# Patient Record
Sex: Male | Born: 1965 | Race: White | Hispanic: No | Marital: Married | Smoking: Never smoker
Health system: Southern US, Community
[De-identification: ages and names within clinical notes are randomized; demographics above are authoritative.]

## PROBLEM LIST (undated history)

## (undated) HISTORY — PX: KNEE SURGERY: SHX244

---

## 1997-08-20 ENCOUNTER — Other Ambulatory Visit: Admission: RE | Admit: 1997-08-20 | Discharge: 1997-08-20 | Payer: Self-pay | Admitting: Obstetrics and Gynecology

## 1997-09-13 ENCOUNTER — Other Ambulatory Visit: Admission: RE | Admit: 1997-09-13 | Discharge: 1997-09-13 | Payer: Self-pay | Admitting: Obstetrics and Gynecology

## 1997-10-04 ENCOUNTER — Other Ambulatory Visit: Admission: RE | Admit: 1997-10-04 | Discharge: 1997-10-04 | Payer: Self-pay | Admitting: Obstetrics and Gynecology

## 1998-02-05 ENCOUNTER — Ambulatory Visit (HOSPITAL_COMMUNITY): Admission: RE | Admit: 1998-02-05 | Discharge: 1998-02-05 | Payer: Self-pay | Admitting: Gynecology

## 2011-04-05 ENCOUNTER — Emergency Department (HOSPITAL_BASED_OUTPATIENT_CLINIC_OR_DEPARTMENT_OTHER)
Admission: EM | Admit: 2011-04-05 | Discharge: 2011-04-05 | Disposition: A | Discharge status: Home / Self Care | Payer: BC Managed Care – PPO | Attending: Emergency Medicine | Admitting: Emergency Medicine

## 2011-04-05 ENCOUNTER — Encounter (HOSPITAL_BASED_OUTPATIENT_CLINIC_OR_DEPARTMENT_OTHER): Payer: Self-pay | Admitting: *Deleted

## 2011-04-05 ENCOUNTER — Emergency Department (INDEPENDENT_AMBULATORY_CARE_PROVIDER_SITE_OTHER): Payer: BC Managed Care – PPO

## 2011-04-05 DIAGNOSIS — R05 Cough: Secondary | ICD-10-CM

## 2011-04-05 DIAGNOSIS — J029 Acute pharyngitis, unspecified: Secondary | ICD-10-CM | POA: Insufficient documentation

## 2011-04-05 DIAGNOSIS — J069 Acute upper respiratory infection, unspecified: Secondary | ICD-10-CM | POA: Insufficient documentation

## 2011-04-05 DIAGNOSIS — R6883 Chills (without fever): Secondary | ICD-10-CM

## 2011-04-05 DIAGNOSIS — R52 Pain, unspecified: Secondary | ICD-10-CM

## 2011-04-05 MED ORDER — BENZONATATE 100 MG PO CAPS: 100.0000 mg | ORAL_CAPSULE | Freq: Three times a day (TID) | ORAL | Status: AC

## 2011-04-05 MED ORDER — OXYMETAZOLINE HCL 0.05 % NA SOLN: 2.0000 | Freq: Two times a day (BID) | NASAL | Status: AC

## 2011-04-05 MED ORDER — IBUPROFEN 600 MG PO TABS
600.0000 mg | ORAL_TABLET | Freq: Four times a day (QID) | ORAL | Status: AC | PRN
Start: 2011-04-05 — End: 2011-04-15

## 2011-04-05 NOTE — ED Provider Notes (Signed)
History     CSN: [id number]  Arrival date & time 04/05/11  1216   First MD Initiated Contact with Patient 04/05/11 1358      Chief Complaint  Patient presents with  . URI    (Consider location/radiation/quality/duration/timing/severity/associated sxs/prior treatment) HPI  45yoM is a healthy presents with multiple complaints. The patient states that for the past 5 days he has experience body aches, rhinorrhea, sore throat, nausea, chills. There's been no fever. He's been exposed to his wife who is tested by her primary care doctor 7 days ago for influenza. He was positive. The patient complains of fatigue. He denies headache, neck stiffness, back pain. He denies weakness. Denies shortness of breath.   ED Notes, ED Provider Notes from 04/05/11 0000 to 04/05/11 12:48:43       Julieanne Manson, RN 04/05/2011 12:46      Cough, runny nose, sore throat, nausea, and chills x 2 days.     History reviewed. No pertinent past medical history.  Past Surgical History  Procedure Date  . Knee surgery     No family history on file.  History  Substance Use Topics  . Smoking status: Never Smoker   . Smokeless tobacco: Not on file  . Alcohol Use: No    Review of Systems  All other systems reviewed and are negative.   except as noted HPI     Allergies  Review of patient's allergies indicates no known allergies.  Home Medications   Current Outpatient Rx  Name Route Sig Dispense Refill  . BENZONATATE 100 MG PO CAPS Oral Take 1 capsule (100 mg total) by mouth every 8 (eight) hours. 21 capsule 0  . IBUPROFEN 600 MG PO TABS Oral Take 1 tablet (600 mg total) by mouth every 6 (six) hours as needed for pain. 30 tablet 0  . OXYMETAZOLINE HCL 0.05 % NA SOLN Nasal Place 2 sprays into the nose 2 (two) times daily. 30 mL 0    BP 115/78  Pulse 82  Temp(Src) 98.2 F (36.8 C) (Oral)  Resp 22  SpO2 97%  Physical Exam  Nursing note and vitals reviewed. Constitutional: He is oriented to  person, place, and time. He appears well-developed and well-nourished. No distress.  HENT:  Head: Atraumatic.  Mouth/Throat: Oropharynx is clear and moist.       Mild erythema posterior OP +nasal congestion  Eyes: Conjunctivae are normal. Pupils are equal, round, and reactive to light.  Neck: Neck supple.  Cardiovascular: Normal rate, regular rhythm, normal heart sounds and intact distal pulses.  Exam reveals no gallop and no friction rub.   No murmur heard. Pulmonary/Chest: Effort normal. No respiratory distress. He has no wheezes. He has no rales.  Abdominal: Soft. Bowel sounds are normal. There is no tenderness. There is no rebound and no guarding.  Musculoskeletal: Normal range of motion. He exhibits no edema and no tenderness.  Neurological: He is alert and oriented to person, place, and time.  Skin: Skin is warm and dry.  Psychiatric: He has a normal mood and affect.    ED Course  Procedures (including critical care time)  Labs Reviewed - No data to display Dg Chest 2 View  04/05/2011  *RADIOLOGY REPORT*  Clinical Data: Cough, chills, bodyaches.  CHEST - 2 VIEW  Comparison: None  Findings: Cardiomediastinal silhouette is within normal limits. The lungs are free of focal consolidations and pleural effusions. No pulmonary edema. Visualized osseous structures have a normal appearance.  IMPRESSION: Negative exam.  Original Report Authenticated By: Patterson Hammersmith, M.D.    1. Influenza-like illness     MDM  Influenza like illness x 5 days with worsening cough with exposure to influenza. CXR negative pna. Home with afrin, ibuprofen, tessalon. PMD f/u as needed.        Forbes Cellar, MD 04/05/11 [phone]

## 2011-04-05 NOTE — ED Notes (Signed)
Cough, runny nose, sore throat, nausea, and chills x 2 days.

## 2012-06-15 IMAGING — CR DG CHEST 2V
2 series · 2 of 2 positions shown · non-contrast
Comparison: None

CLINICAL DATA: Cough, chills, bodyaches.

CHEST - 2 VIEW

[w chest pa]
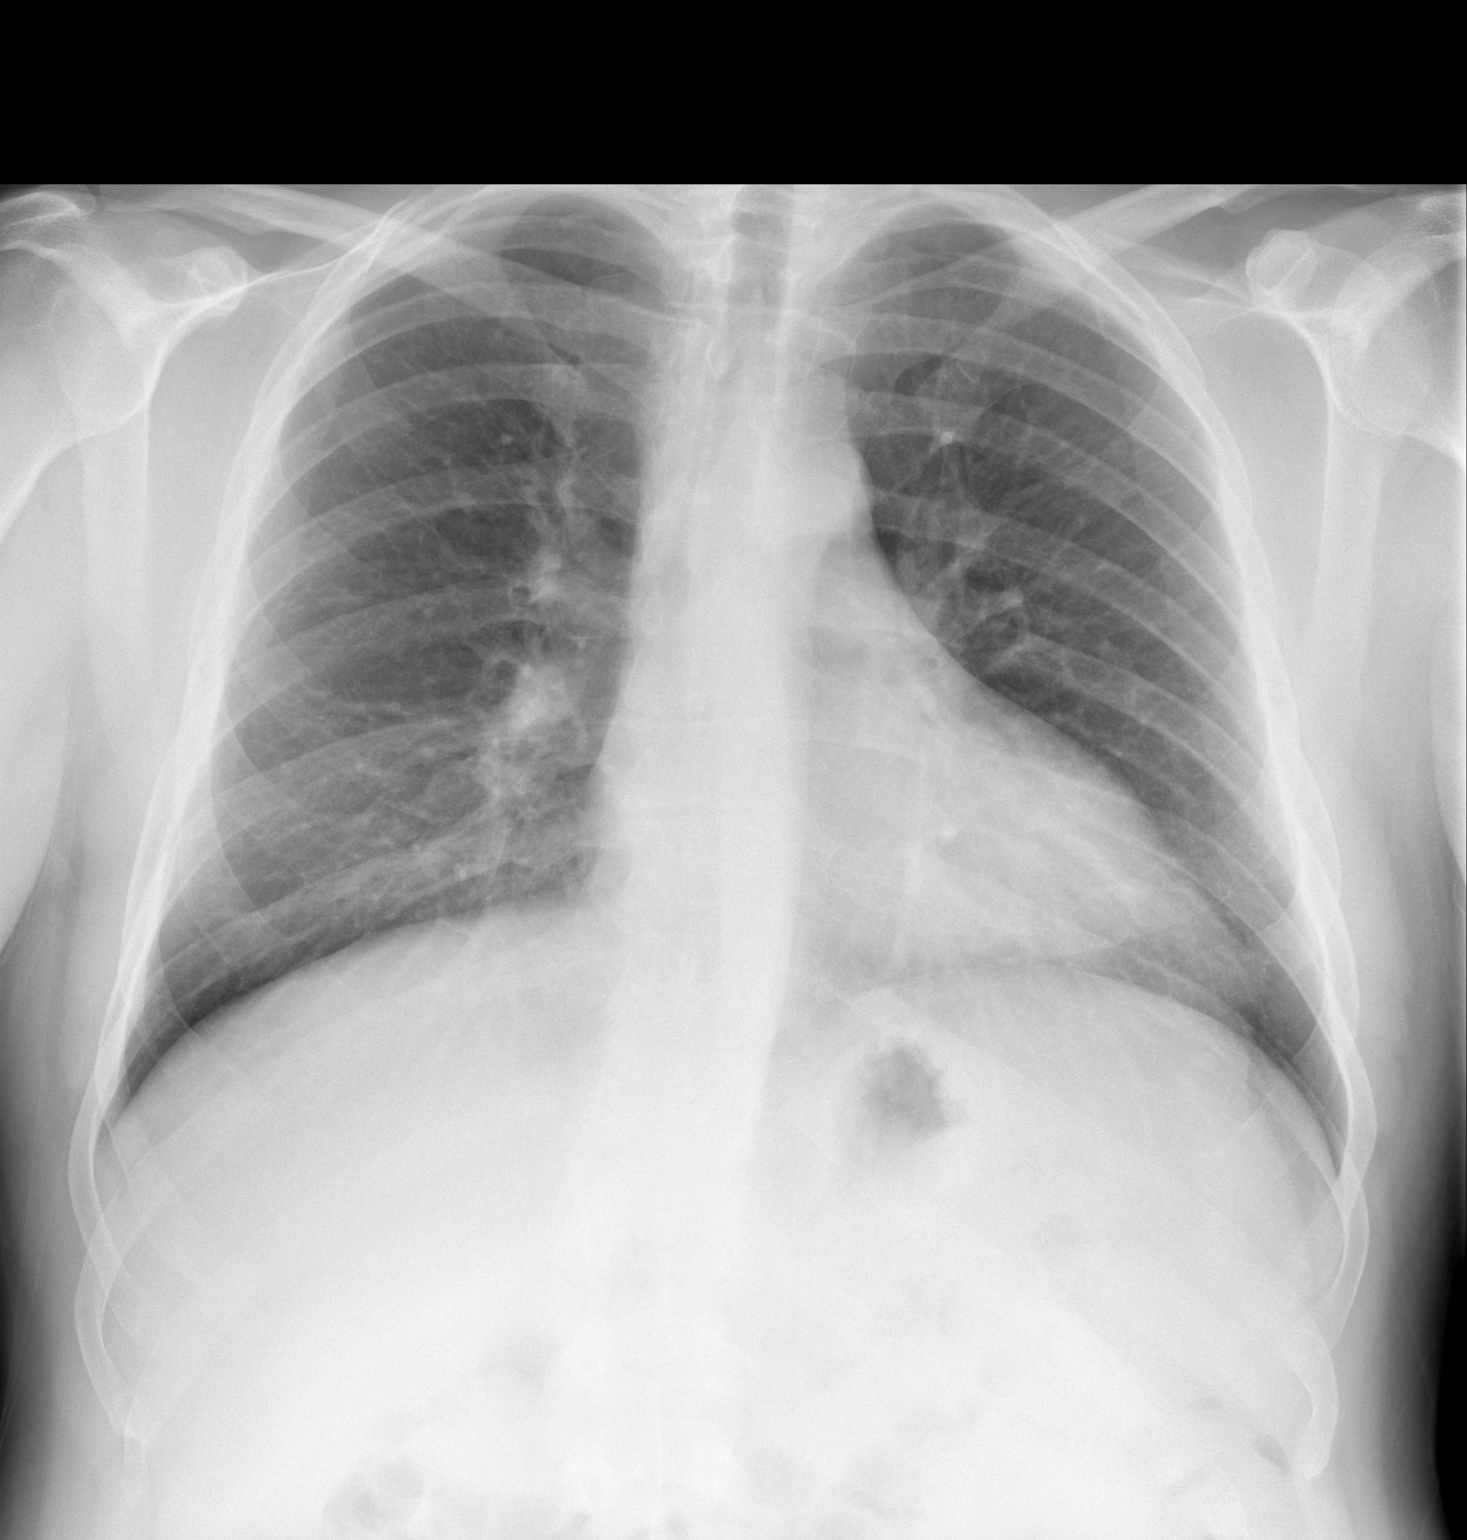

[w chest lat]
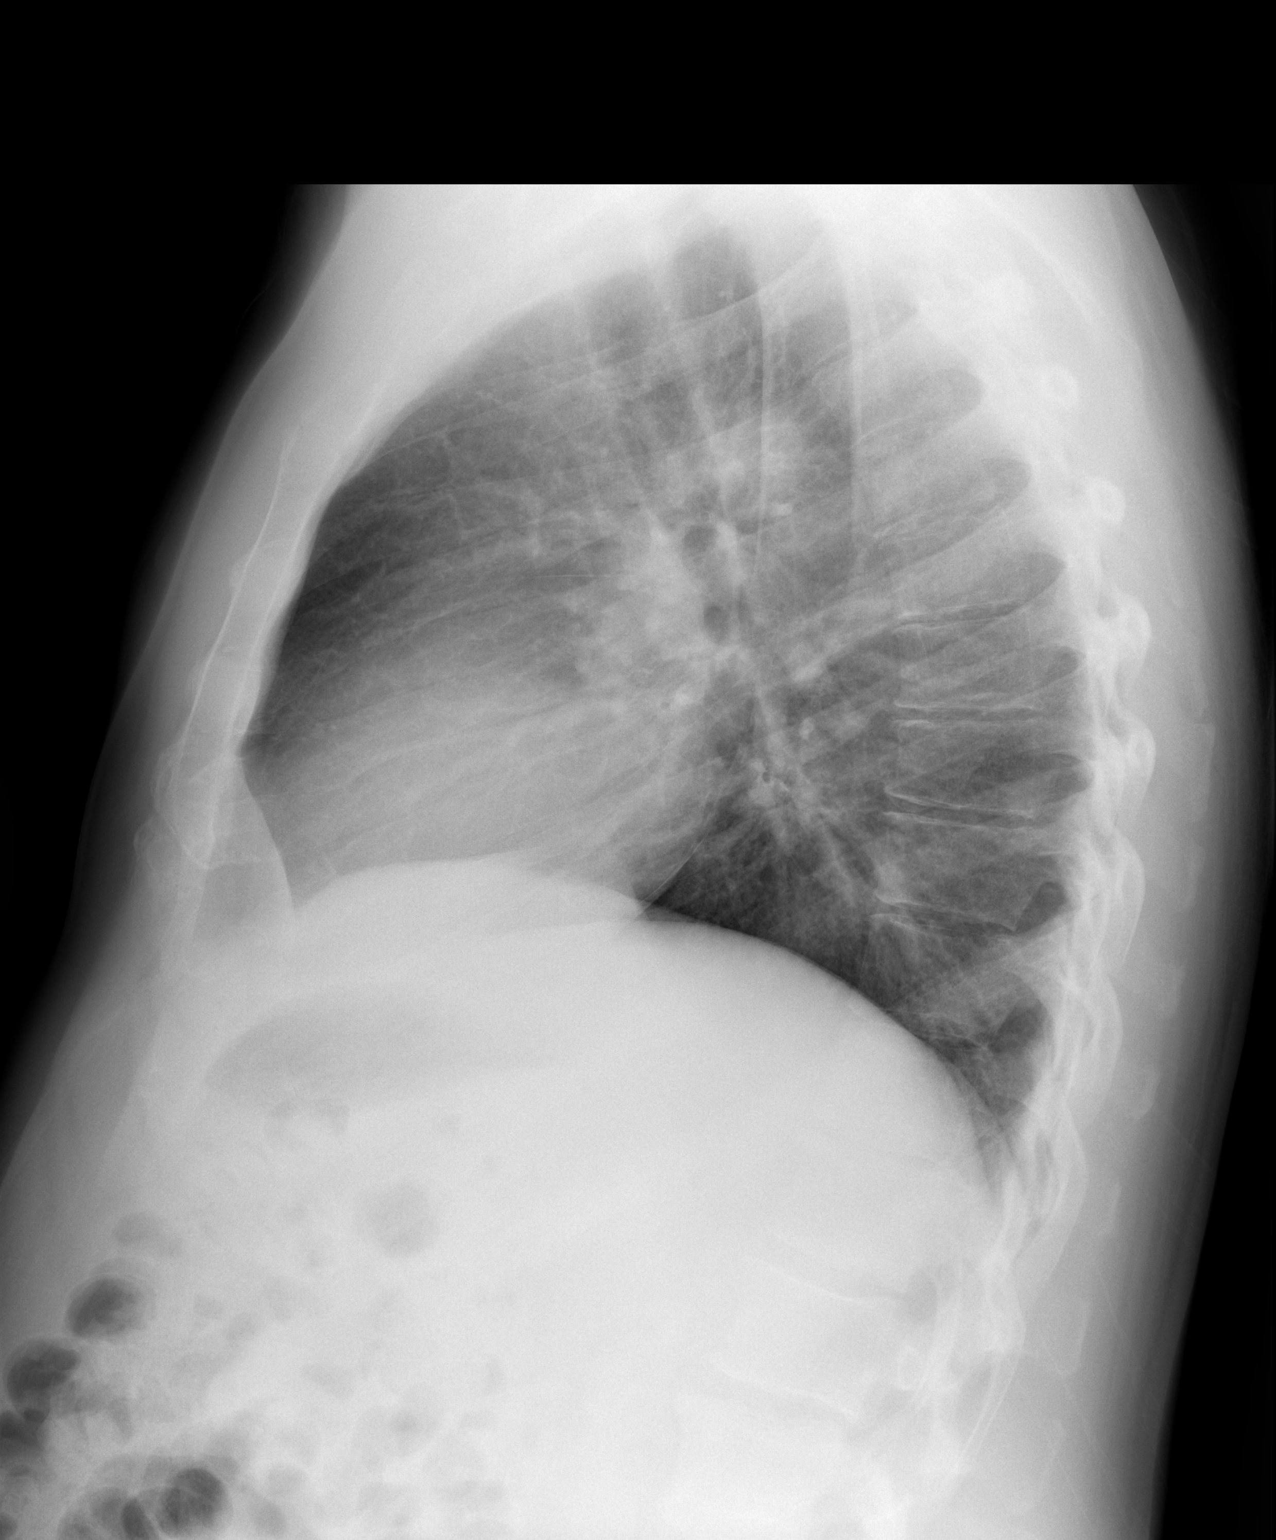

[2 of 2 positions shown; findings below may reference images not displayed]

FINDINGS: Cardiomediastinal silhouette is within normal limits.
The lungs are free of focal consolidations and pleural effusions.
No pulmonary edema. Visualized osseous structures have a normal
appearance.
IMPRESSION: Negative exam.

## 2013-02-17 ENCOUNTER — Emergency Department (INDEPENDENT_AMBULATORY_CARE_PROVIDER_SITE_OTHER)
Admission: EM | Admit: 2013-02-17 | Discharge: 2013-02-17 | Disposition: A | Discharge status: Home / Self Care | Payer: BC Managed Care – PPO | Source: Home / Self Care | Attending: Family Medicine | Admitting: Family Medicine

## 2013-02-17 ENCOUNTER — Encounter: Payer: Self-pay | Admitting: Emergency Medicine

## 2013-02-17 DIAGNOSIS — J209 Acute bronchitis, unspecified: Secondary | ICD-10-CM

## 2013-02-17 MED ORDER — AZITHROMYCIN 250 MG PO TABS: ORAL_TABLET | ORAL | Status: DC

## 2013-02-17 MED ORDER — BENZONATATE 200 MG PO CAPS: 200.0000 mg | ORAL_CAPSULE | Freq: Every day | ORAL | Status: DC

## 2013-02-17 NOTE — ED Notes (Signed)
Patient c/o dry cough, congestion, sore throat in the mornings x 9-10 days. Patient also c/o eye drainage and pain. Patient has tried OTC Dayquil, Nyquil and Tylenol with some relief.

## 2013-02-17 NOTE — ED Provider Notes (Signed)
CSN: [id number]     Arrival date & time 02/17/13  1502 History   First MD Initiated Contact with Patient 02/17/13 1647     Chief Complaint  Patient presents with  . Cough  . Nasal Congestion  . Sore Throat      HPI Comments: Patient developed a nonproductive cough about 9 to 10 days ago that has persisted.  He has had persistent sinus congestion and sore throat each morning.  No fevers, chills, and sweats.  He sometimes coughs until he gags.  The history is provided by the patient.    History reviewed. No pertinent past medical history. Past Surgical History  Procedure Laterality Date  . Knee surgery     History reviewed. No pertinent family history. History  Substance Use Topics  . Smoking status: Never Smoker   . Smokeless tobacco: Not on file  . Alcohol Use: No    Review of Systems + sore throat + cough No pleuritic pain No wheezing + nasal congestion + post-nasal drainage ? sinus pain/pressure No itchy/red eyes No earache No hemoptysis No SOB No fever/chills No nausea No vomiting No abdominal pain No diarrhea No urinary symptoms No skin rash + fatigue No myalgias No headache Used OTC meds with mild relief  Allergies  Review of patient's allergies indicates no known allergies.  Home Medications   Current Outpatient Rx  Name  Route  Sig  Dispense  Refill  . azithromycin (ZITHROMAX Z-PAK) 250 MG tablet      Take 2 tabs today; then begin one tab once daily for 4 more days.   6 each   0   . benzonatate (TESSALON) 200 MG capsule   Oral   Take 1 capsule (200 mg total) by mouth at bedtime. Take as needed for cough   12 capsule   0    BP 127/81  Pulse 104  Temp(Src) 98 F (36.7 C) (Oral)  Resp 20  Ht 5\' 11"  (1.803 m)  Wt 231 lb 4 oz (104.894 kg)  BMI 32.27 kg/m2  SpO2 95% Physical Exam Nursing notes and Vital Signs reviewed. Appearance:  Patient appears stated age, and in no acute distress.  Patient is obese (BMI 32.3) Eyes:  Pupils are  equal, round, and reactive to light and accomodation.  Extraocular movement is intact.  Conjunctivae are not inflamed  Ears:  Canals normal.  Tympanic membranes normal.  Nose:  Mildly congested turbinates.  No sinus tenderness.  Pharynx:  Normal Neck:  Supple.   No adenopathy Lungs:  Clear to auscultation.  Breath sounds are equal.  Heart:  Regular rate and rhythm without murmurs, rubs, or gallops.  Abdomen:  Nontender without masses or hepatosplenomegaly.  Bowel sounds are present.  No CVA or flank tenderness.  Extremities:  No edema.  No calf tenderness Skin:  No rash present.   ED Course  Procedures  none    MDM   1. Acute bronchitis; ?pertussis    Begin Z-pack.  Prescription written for Benzonatate [facility]) to take at bedtime for night-time cough.  Take Mucinex D (1200mg  guaifenesin with decongestant) twice daily for congestion.  Increase fluid intake, rest. May use Afrin nasal spray (or generic oxymetazoline) twice daily for about 5 days.  Also recommend using saline nasal spray several times daily and saline nasal irrigation (AYR is a common brand) Stop all antihistamines for now, and other non-prescription cough/cold preparations. Followup with Family Doctor if not improved in one week.    Lattie Haw, MD  02/19/13 1851 

## 2013-02-24 ENCOUNTER — Encounter: Payer: Self-pay | Admitting: Emergency Medicine

## 2013-02-24 ENCOUNTER — Emergency Department (INDEPENDENT_AMBULATORY_CARE_PROVIDER_SITE_OTHER)
Admission: EM | Admit: 2013-02-24 | Discharge: 2013-02-24 | Disposition: A | Discharge status: Home / Self Care | Payer: BC Managed Care – PPO | Source: Home / Self Care | Attending: Emergency Medicine | Admitting: Emergency Medicine

## 2013-02-24 DIAGNOSIS — A09 Infectious gastroenteritis and colitis, unspecified: Secondary | ICD-10-CM

## 2013-02-24 MED ORDER — DIPHENOXYLATE-ATROPINE 2.5-0.025 MG PO TABS: 1.0000 | ORAL_TABLET | Freq: Four times a day (QID) | ORAL | Status: DC

## 2013-02-24 MED ORDER — METRONIDAZOLE 500 MG PO TABS: 500.0000 mg | ORAL_TABLET | Freq: Three times a day (TID) | ORAL | Status: DC

## 2013-02-24 NOTE — ED Notes (Signed)
Pt has had Diarrhea for 2-3 days taking Pepto with no relief. Pt has also had a lot of gas and bloating.

## 2013-02-24 NOTE — ED Provider Notes (Signed)
CSN: [id number]     Arrival date & time 02/24/13  1151 History   First MD Initiated Contact with Patient 02/24/13 1251     Chief Complaint  Patient presents with  . Diarrhea   (Consider location/radiation/quality/duration/timing/severity/associated sxs/prior Treatment) HPI 3 days of progressively worsening loose, watery stools, grayish green color, foul odor. Mild mucus. No blood. Having at least 10 diarrheal stools per day. Decreased appetite but tolerating by mouth's well without nausea or vomiting. May have had a low-grade fever but he's not sure. Has tried Pepto-Bismol without relief. He also has a lot of gas and bloating feeling   He just finished taking a five-day course of Zithromax Z-Pak, about 1 day before the diarrhea started.  History reviewed. No pertinent past medical history. Past Surgical History  Procedure Laterality Date  . Knee surgery     History reviewed. No pertinent family history. History  Substance Use Topics  . Smoking status: Never Smoker   . Smokeless tobacco: Not on file  . Alcohol Use: No    Review of Systems  Constitutional: Positive for fever and fatigue. Negative for chills.  HENT: Negative.   Respiratory: Negative.   Cardiovascular: Negative.   Gastrointestinal: Negative for vomiting, abdominal pain and blood in stool.  Genitourinary: Negative.   Musculoskeletal: Negative for arthralgias and myalgias.  Skin: Negative for rash.  Psychiatric/Behavioral: Negative.   All other systems reviewed and are negative.    Allergies  Review of patient's allergies indicates no known allergies.  Home Medications   Current Outpatient Rx  Name  Route  Sig  Dispense  Refill  . azithromycin (ZITHROMAX Z-PAK) 250 MG tablet      Take 2 tabs today; then begin one tab once daily for 4 more days.   6 each   0   . benzonatate (TESSALON) 200 MG capsule   Oral   Take 1 capsule (200 mg total) by mouth at bedtime. Take as needed for cough   12  capsule   0   . diphenoxylate-atropine (LOMOTIL) 2.5-0.025 MG per tablet   Oral   Take 1 tablet by mouth every 6 (six) hours. As needed for diarrhea   15 tablet   0   . metroNIDAZOLE (FLAGYL) 500 MG tablet   Oral   Take 1 tablet (500 mg total) by mouth 3 (three) times daily.   21 tablet   0    BP 111/76  Pulse 100  Temp(Src) 98.1 F (36.7 C) (Oral)  Ht 5\' 11"  (1.803 m)  Wt 225 lb 8 oz (102.286 kg)  BMI 31.46 kg/m2  SpO2 96% Physical Exam  Nursing note and vitals reviewed. Constitutional: He is oriented to person, place, and time. He appears well-developed and well-nourished. No distress.  HENT:  Right Ear: External ear normal.  Left Ear: External ear normal.  Nose: Nose normal.  Mouth/Throat: Oropharynx is clear and moist. No oropharyngeal exudate.  Eyes: Conjunctivae are normal. Right eye exhibits no discharge. Left eye exhibits no discharge. No scleral icterus.  Neck: Normal range of motion. Neck supple.  Cardiovascular: Normal rate, regular rhythm and normal heart sounds.   Pulmonary/Chest: Breath sounds normal.  Abdominal: Soft. He exhibits no abdominal bruit and no pulsatile midline mass. Bowel sounds are increased. There is no hepatosplenomegaly. There is tenderness (Minimal, diffuse, nonreproducible). There is no rigidity, no rebound, no guarding, no CVA tenderness, no tenderness at McBurney's point and negative Murphy's sign.  Musculoskeletal: Normal range of motion.  Neurological: He is alert and  oriented to person, place, and time. No cranial nerve deficit.  Skin: Skin is warm and dry. No rash noted.  Psychiatric: He has a normal mood and affect.    ED Course  Procedures (including critical care time) Labs Review Labs Reviewed - No data to display Imaging Review No results found.  EKG Interpretation    Date/Time:    Ventricular Rate:    PR Interval:    QRS Duration:   QT Interval:    QTC Calculation:   R Axis:     Text Interpretation:               MDM   1. Infectious diarrhea in adult patient    Discussed workup and treatment options. He declined any testing. Metronidazole prescribed. 500 3 times a day x7 days Judicious use of Lomotil, precautions discussed. Follow up with your primary care physician or specialist if not improving, having worsening of symptoms, or new severe symptoms.  Precautions discussed. Red flags discussed. Questions invited and answered. Patient voiced understanding and agreement.    Lajean Manes, MD 02/24/13 [phone]

## 2013-03-04 ENCOUNTER — Emergency Department (INDEPENDENT_AMBULATORY_CARE_PROVIDER_SITE_OTHER)
Admission: EM | Admit: 2013-03-04 | Discharge: 2013-03-04 | Disposition: A | Discharge status: Home / Self Care | Payer: BC Managed Care – PPO | Source: Home / Self Care | Attending: Family Medicine | Admitting: Family Medicine

## 2013-03-04 ENCOUNTER — Encounter: Payer: Self-pay | Admitting: Emergency Medicine

## 2013-03-04 DIAGNOSIS — M7712 Lateral epicondylitis, left elbow: Secondary | ICD-10-CM

## 2013-03-04 DIAGNOSIS — M7710 Lateral epicondylitis, unspecified elbow: Secondary | ICD-10-CM

## 2013-03-04 MED ORDER — MELOXICAM 15 MG PO TABS: 15.0000 mg | ORAL_TABLET | Freq: Every day | ORAL | Status: AC

## 2013-03-04 NOTE — ED Notes (Signed)
Patient c/o left arm pain shoulder blade to elbow x 2 days; states the elbow is the most tender. Patient has tried OTC Aleve, rx gabapentin and topical cream without relief.

## 2013-03-04 NOTE — ED Provider Notes (Signed)
CSN: [id number]     Arrival date & time 03/04/13  1132 History   First MD Initiated Contact with Patient 03/04/13 1214     Chief Complaint  Patient presents with  . Arm Pain      HPI Comments: Patient complains of persistent pain in his left elbow, now radiating to the left shoulder.  He recalls no injury, although his job involves some light repetitive activity.  He had no response when he applied Voltaren cream.  Patient is a 48 y.o. male presenting with arm injury. The history is provided by the patient.  Arm Injury Location:  Elbow Time since incident:  2 days Injury: no   Elbow location:  R elbow Pain details:    Quality:  Aching   Radiates to:  L upper arm   Severity:  Moderate   Onset quality:  Sudden   Duration:  2 days   Timing:  Constant   Progression:  Worsening Chronicity:  New Handedness:  Right-handed Dislocation: no   Foreign body present:  No foreign bodies Prior injury to area:  No Relieved by:  Nothing Ineffective treatments:  NSAIDs Associated symptoms: no decreased range of motion, no fever, no muscle weakness, no neck pain, no numbness, no stiffness, no swelling and no tingling     History reviewed. No pertinent past medical history. Past Surgical History  Procedure Laterality Date  . Knee surgery     History reviewed. No pertinent family history. History  Substance Use Topics  . Smoking status: Never Smoker   . Smokeless tobacco: Not on file  . Alcohol Use: No    Review of Systems  Constitutional: Negative for fever.  Musculoskeletal: Negative for neck pain and stiffness.  All other systems reviewed and are negative.    Allergies  Review of patient's allergies indicates no known allergies.  Home Medications   Current Outpatient Rx  Name  Route  Sig  Dispense  Refill  . meloxicam (MOBIC) 15 MG tablet   Oral   Take 1 tablet (15 mg total) by mouth daily. Take with food each morning   15 tablet   0    BP 108/75  Pulse 96  Temp(Src)  98.2 F (36.8 C) (Oral)  Resp 16  Ht 5\' 11"  (1.803 m)  Wt 227 lb 8 oz (103.193 kg)  BMI 31.74 kg/m2  SpO2 97% Physical Exam  Nursing note and vitals reviewed. Constitutional: He appears well-developed and well-nourished.  HENT:  Head: Normocephalic and atraumatic.  Mouth/Throat: Oropharynx is clear and moist.  Eyes: Conjunctivae are normal. Pupils are equal, round, and reactive to light.  Musculoskeletal:       Left shoulder: Normal.       Left elbow: He exhibits normal range of motion, no swelling, no effusion, no deformity and no laceration. Tenderness found. Lateral epicondyle tenderness noted. No radial head, no medial epicondyle and no olecranon process tenderness noted.  There is distinct tenderness over the left lateral epicondyle.  Palpation there during resisted dorsiflexion and supination of the wrist recreates his pain.   Neurological: He is alert.  Skin: Skin is warm and dry. No rash noted. No erythema.    ED Course  Procedures  none      MDM   1. Lateral epicondylitis of elbow, left    Tennis elbow brace applied.  Begin Mobic. Apply ice pack 2 to 3 times daily.  Wear elbow brace.  Begin range of motion and stretching exercises. Followup with Dr. Rodney Langtonhomas Thekkekand[facility] (  [facility]) if not improving about two weeks.     Lattie Haw, MD 03/05/13 [phone]

## 2013-03-04 NOTE — Discharge Instructions (Signed)
Apply ice pack 2 to 3 times daily.  Wear elbow brace.  Begin range of motion and stretching exercises.   Tennis Elbow Your caregiver has diagnosed you with a condition often referred to as "tennis elbow." This results from small tears or soreness (inflammation) at the start (origin) of the extensor muscles of the forearm. Although the condition is often called tennis or golfer's elbow, it is caused by any repetitive action performed by your elbow. HOME CARE INSTRUCTIONS  If the condition has been short lived, rest may be the only treatment required. Using your opposite hand or arm to perform the task may help. Even changing your grip may help rest the extremity. These may even prevent the condition from recurring.  Longer standing problems, however, will often be relieved faster by:  Using anti-inflammatory agents.  Applying ice packs for 30 minutes at the end of the working day, at bed time, or when activities are finished.  Your caregiver may also have you wear a splint or sling. This will allow the inflamed tendon to heal. At times, steroid injections aided with a local anesthetic will be required along with splinting for 1 to 2 weeks. Two to three steroid injections will often solve the problem. In some long standing cases, the inflamed tendon does not respond to conservative (non-surgical) therapy. Then surgery may be required to repair it. MAKE SURE YOU:   Understand these instructions.  Will watch your condition.  Will get help right away if you are not doing well or get worse. Document Released: 02/15/2005 Document Revised: 05/10/2011 Document Reviewed: 10/04/2007 [facility] Patient Information 2014 [place], [place].

## 2013-04-12 ENCOUNTER — Ambulatory Visit: Payer: BC Managed Care – PPO | Admitting: Licensed Clinical Social Worker

## 2013-05-01 ENCOUNTER — Ambulatory Visit: Payer: BC Managed Care – PPO | Admitting: Licensed Clinical Social Worker

## 2013-05-10 ENCOUNTER — Ambulatory Visit: Payer: BC Managed Care – PPO | Admitting: Licensed Clinical Social Worker

## 2013-07-10 ENCOUNTER — Ambulatory Visit: Payer: BC Managed Care – PPO | Admitting: Licensed Clinical Social Worker

## 2013-07-31 ENCOUNTER — Ambulatory Visit (INDEPENDENT_AMBULATORY_CARE_PROVIDER_SITE_OTHER): Payer: BC Managed Care – PPO | Admitting: Licensed Clinical Social Worker

## 2013-07-31 DIAGNOSIS — F331 Major depressive disorder, recurrent, moderate: Secondary | ICD-10-CM

## 2013-08-23 ENCOUNTER — Ambulatory Visit (INDEPENDENT_AMBULATORY_CARE_PROVIDER_SITE_OTHER): Payer: BC Managed Care – PPO | Admitting: Licensed Clinical Social Worker

## 2013-08-23 DIAGNOSIS — F331 Major depressive disorder, recurrent, moderate: Secondary | ICD-10-CM

## 2014-10-31 ENCOUNTER — Ambulatory Visit (INDEPENDENT_AMBULATORY_CARE_PROVIDER_SITE_OTHER): Payer: BLUE CROSS/BLUE SHIELD | Admitting: Licensed Clinical Social Worker

## 2014-10-31 DIAGNOSIS — F332 Major depressive disorder, recurrent severe without psychotic features: Secondary | ICD-10-CM | POA: Diagnosis not present

## 2014-11-21 ENCOUNTER — Ambulatory Visit (INDEPENDENT_AMBULATORY_CARE_PROVIDER_SITE_OTHER): Payer: BLUE CROSS/BLUE SHIELD | Admitting: Licensed Clinical Social Worker

## 2014-11-21 DIAGNOSIS — F332 Major depressive disorder, recurrent severe without psychotic features: Secondary | ICD-10-CM | POA: Diagnosis not present

## 2014-12-12 ENCOUNTER — Ambulatory Visit (INDEPENDENT_AMBULATORY_CARE_PROVIDER_SITE_OTHER): Payer: BLUE CROSS/BLUE SHIELD | Admitting: Licensed Clinical Social Worker

## 2014-12-12 DIAGNOSIS — F332 Major depressive disorder, recurrent severe without psychotic features: Secondary | ICD-10-CM

## 2015-01-02 ENCOUNTER — Ambulatory Visit (INDEPENDENT_AMBULATORY_CARE_PROVIDER_SITE_OTHER): Payer: BLUE CROSS/BLUE SHIELD | Admitting: Licensed Clinical Social Worker

## 2015-01-02 DIAGNOSIS — F332 Major depressive disorder, recurrent severe without psychotic features: Secondary | ICD-10-CM | POA: Diagnosis not present

## 2015-01-30 ENCOUNTER — Ambulatory Visit (INDEPENDENT_AMBULATORY_CARE_PROVIDER_SITE_OTHER): Payer: BLUE CROSS/BLUE SHIELD | Admitting: Licensed Clinical Social Worker

## 2015-01-30 DIAGNOSIS — F332 Major depressive disorder, recurrent severe without psychotic features: Secondary | ICD-10-CM | POA: Diagnosis not present

## 2015-02-20 ENCOUNTER — Ambulatory Visit (INDEPENDENT_AMBULATORY_CARE_PROVIDER_SITE_OTHER): Payer: BLUE CROSS/BLUE SHIELD | Admitting: Licensed Clinical Social Worker

## 2015-02-20 DIAGNOSIS — F332 Major depressive disorder, recurrent severe without psychotic features: Secondary | ICD-10-CM

## 2015-03-20 ENCOUNTER — Ambulatory Visit (INDEPENDENT_AMBULATORY_CARE_PROVIDER_SITE_OTHER): Payer: BLUE CROSS/BLUE SHIELD | Admitting: Licensed Clinical Social Worker

## 2015-03-20 DIAGNOSIS — F332 Major depressive disorder, recurrent severe without psychotic features: Secondary | ICD-10-CM

## 2015-03-28 ENCOUNTER — Ambulatory Visit: Payer: BLUE CROSS/BLUE SHIELD | Admitting: Psychology

## 2015-05-02 ENCOUNTER — Ambulatory Visit: Payer: BLUE CROSS/BLUE SHIELD | Admitting: Psychology

## 2015-05-30 ENCOUNTER — Ambulatory Visit (INDEPENDENT_AMBULATORY_CARE_PROVIDER_SITE_OTHER): Payer: BLUE CROSS/BLUE SHIELD | Admitting: Psychology

## 2015-05-30 DIAGNOSIS — F332 Major depressive disorder, recurrent severe without psychotic features: Secondary | ICD-10-CM

## 2015-07-04 ENCOUNTER — Ambulatory Visit: Payer: BLUE CROSS/BLUE SHIELD | Admitting: Psychology

## 2017-07-07 ENCOUNTER — Encounter (HOSPITAL_BASED_OUTPATIENT_CLINIC_OR_DEPARTMENT_OTHER): Payer: Self-pay

## 2017-07-07 ENCOUNTER — Emergency Department (HOSPITAL_BASED_OUTPATIENT_CLINIC_OR_DEPARTMENT_OTHER)
Admission: EM | Admit: 2017-07-07 | Discharge: 2017-07-08 | Disposition: A | Discharge status: Home / Self Care | Payer: Worker's Compensation | Attending: Emergency Medicine | Admitting: Emergency Medicine

## 2017-07-07 ENCOUNTER — Other Ambulatory Visit: Payer: Self-pay

## 2017-07-07 DIAGNOSIS — S0101XA Laceration without foreign body of scalp, initial encounter: Secondary | ICD-10-CM | POA: Diagnosis not present

## 2017-07-07 DIAGNOSIS — Y990 Civilian activity done for income or pay: Secondary | ICD-10-CM | POA: Diagnosis not present

## 2017-07-07 DIAGNOSIS — Y9289 Other specified places as the place of occurrence of the external cause: Secondary | ICD-10-CM | POA: Diagnosis not present

## 2017-07-07 DIAGNOSIS — Y289XXA Contact with unspecified sharp object, undetermined intent, initial encounter: Secondary | ICD-10-CM | POA: Insufficient documentation

## 2017-07-07 DIAGNOSIS — Y9389 Activity, other specified: Secondary | ICD-10-CM | POA: Insufficient documentation

## 2017-07-07 NOTE — ED Notes (Signed)
ED Provider at bedside. 

## 2017-07-07 NOTE — ED Triage Notes (Signed)
Pt states he cut top pf head of a "conveyor hook"-lac on top of head-bleeding controlled-no LOC

## 2017-07-08 MED ORDER — ACETAMINOPHEN 500 MG PO TABS
1000.0000 mg | ORAL_TABLET | Freq: Once | ORAL | Status: AC
Start: 2017-07-08 — End: 2017-07-08
  Administered 2017-07-08: 1000 mg via ORAL
  Filled 2017-07-08: qty 2

## 2017-07-08 MED ORDER — NAPROXEN 250 MG PO TABS
500.0000 mg | ORAL_TABLET | Freq: Once | ORAL | Status: AC
  Administered 2017-07-08: 500 mg via ORAL
  Filled 2017-07-08: qty 2

## 2017-07-08 NOTE — ED Provider Notes (Signed)
[facility] EMERGENCY DEPARTMENT Provider Note   CSN: [id number] Arrival date & time: 07/07/17  2021     History   Chief Complaint Chief Complaint  Patient presents with  . Head Injury    HPI Donald Mercado is a 52 y.o. male.  HPI Patient presents to the emergency department with a laceration to the top of his head.  The patient states that he was at work this evening when he raised up under a conveyor belt and cut his head on a sharp metal edge.  The patient states that he did not lose consciousness.  He states that he did not take any medications prior to arrival.  The patient states that he did apply pressure to control the bleeding.  Patient states that nothing seems to make the condition better or worse.  Patient denies any nausea, vomiting, blurred vision, neck pain, dizziness, weakness, numbness or syncope. History reviewed. No pertinent past medical history.  There are no active problems to display for this patient.   Past Surgical History:  Procedure Laterality Date  . KNEE SURGERY          Home Medications    Prior to Admission medications   Medication Sig Start Date End Date Taking? Authorizing Provider  meloxicam (MOBIC) 15 MG tablet Take 1 tablet (15 mg total) by mouth daily. Take with food each morning 03/04/13   Lattie Haw, MD    Family History No family history on file.  Social History Social History   Tobacco Use  . Smoking status: Never Smoker  . Smokeless tobacco: Never Used  Substance Use Topics  . Alcohol use: Yes    Comment: weekly  . Drug use: No     Allergies   Patient has no known allergies.   Review of Systems Review of Systems  All other systems negative except as documented in the HPI. All pertinent positives and negatives as reviewed in the HPI. Physical Exam Updated Vital Signs BP 114/90 (BP Location: Left Arm)   Pulse 80   Temp 98.5 F (36.9 C) (Oral)   Resp 18   SpO2 96%   Physical Exam    Constitutional: He is oriented to person, place, and time. He appears well-developed and well-nourished. No distress.  HENT:  Head: Normocephalic and atraumatic.  Eyes: Pupils are equal, round, and reactive to light.  Pulmonary/Chest: Effort normal.  Neurological: He is alert and oriented to person, place, and time.  Skin: Skin is warm and dry.  Psychiatric: He has a normal mood and affect.  Nursing note and vitals reviewed.    ED Treatments / Results  Labs (all labs ordered are listed, but only abnormal results are displayed) Labs Reviewed - No data to display  EKG None  Radiology No results found.  Procedures Procedures (including critical care time)  Medications Ordered in ED Medications  naproxen (NAPROSYN) tablet 500 mg (has no administration in time range)  acetaminophen (TYLENOL) tablet 1,000 mg (has no administration in time range)     Initial Impression / Assessment and Plan / ED Course  I have reviewed the triage vital signs and the nursing notes.  Pertinent labs & imaging results that were available during my care of the patient were reviewed by me and considered in my medical decision making (see chart for details).    LACERATION REPAIR Performed by: Carlyle Dolly Authorized by: Jamesetta Orleans Yeilyn Gent Consent: Verbal consent obtained. Risks and benefits: risks, benefits and alternatives were discussed  Consent given by: patient Patient identity confirmed: provided demographic data Prepped and Draped in normal sterile fashion Wound explored  Laceration Location: Mid superior scalp  Laceration Length: 4 cm  No Foreign Bodies seen or palpated  Anesthesia: local infiltration  Local anesthetic: None  Anesthetic total: Not applicable  Irrigation method: syringe Amount of cleaning: standard  Skin closure:  surgical staples  Number of sutures: 4  Technique: Staples  Patient tolerance: Patient tolerated the procedure well with no  immediate complications.   Have the staples out in 7 days.  Told to keep the area clean and dry.  Told to return here for any changes or worsening in his condition.  Final Clinical Impressions(s) / ED Diagnoses   Final diagnoses:  None    ED Discharge Orders    None       Charlestine Night, PA-C 07/08/17 0012    Loren Racer, MD 07/08/17 [phone]

## 2017-07-08 NOTE — ED Notes (Signed)
Pt and family understood dc material. NAD noted. Workmans Comp paperwork given at [employer]

## 2017-07-08 NOTE — Discharge Instructions (Addendum)
Return here as needed.  Have staples out in 7 days.  Keep the area clean and dry.

## 2017-07-14 ENCOUNTER — Emergency Department (HOSPITAL_BASED_OUTPATIENT_CLINIC_OR_DEPARTMENT_OTHER)
Admission: EM | Admit: 2017-07-14 | Discharge: 2017-07-14 | Disposition: A | Discharge status: Home / Self Care | Payer: Worker's Compensation | Attending: Physician Assistant | Admitting: Physician Assistant

## 2017-07-14 ENCOUNTER — Other Ambulatory Visit: Payer: Self-pay

## 2017-07-14 ENCOUNTER — Encounter (HOSPITAL_BASED_OUTPATIENT_CLINIC_OR_DEPARTMENT_OTHER): Payer: Self-pay | Admitting: Adult Health

## 2017-07-14 DIAGNOSIS — S0181XD Laceration without foreign body of other part of head, subsequent encounter: Secondary | ICD-10-CM | POA: Diagnosis not present

## 2017-07-14 DIAGNOSIS — Z4802 Encounter for removal of sutures: Secondary | ICD-10-CM

## 2017-07-14 DIAGNOSIS — X58XXXD Exposure to other specified factors, subsequent encounter: Secondary | ICD-10-CM | POA: Insufficient documentation

## 2017-07-14 DIAGNOSIS — Z79899 Other long term (current) drug therapy: Secondary | ICD-10-CM | POA: Diagnosis not present

## 2017-07-14 NOTE — ED Provider Notes (Signed)
[facility] EMERGENCY DEPARTMENT Provider Note   CSN: [id number] Arrival date & time: 07/14/17  1654     History   Chief Complaint Chief Complaint  Patient presents with  . Suture / Staple Removal    HPI Donald Mercado is a 52 y.o. male presenting for evaluation of staple removal.  Patient states he had staples placed in his head on May 10.  He has had no issues since.  No pain, drainage, or fevers.  No headaches.  No concerns at this time.  HPI  History reviewed. No pertinent past medical history.  There are no active problems to display for this patient.   Past Surgical History:  Procedure Laterality Date  . KNEE SURGERY          Home Medications    Prior to Admission medications   Medication Sig Start Date End Date Taking? Authorizing Provider  meloxicam (MOBIC) 15 MG tablet Take 1 tablet (15 mg total) by mouth daily. Take with food each morning 03/04/13   Lattie Haw, MD    Family History History reviewed. No pertinent family history.  Social History Social History   Tobacco Use  . Smoking status: Never Smoker  . Smokeless tobacco: Never Used  Substance Use Topics  . Alcohol use: Yes    Comment: weekly  . Drug use: No     Allergies   Patient has no known allergies.   Review of Systems Review of Systems  Constitutional: Negative for fever.  Neurological: Negative for headaches.  Hematological: Does not bruise/bleed easily.     Physical Exam Updated Vital Signs BP 127/81   Pulse 67   Temp 98.3 F (36.8 C) (Oral)   Resp 18   Ht  (1.778 m)   Wt 97.5 kg (215 lb)   SpO2 99%   BMI 30.85 kg/m   Physical Exam  Constitutional: He is oriented to person, place, and time. He appears well-developed and well-nourished. No distress.  HENT:  Head: Normocephalic.  Eyes: EOM are normal.  Neck: Normal range of motion.  Pulmonary/Chest: Effort normal.  Abdominal: He exhibits no distension.  Musculoskeletal: Normal range  of motion.  Neurological: He is alert and oriented to person, place, and time.  Skin: Skin is warm. No rash noted.  Well-healing laceration of the head without surrounding erythema, warmth.  No drainage or bleeding.  No tenderness  Psychiatric: He has a normal mood and affect.  Nursing note and vitals reviewed.    ED Treatments / Results  Labs (all labs ordered are listed, but only abnormal results are displayed) Labs Reviewed - No data to display  EKG None  Radiology No results found.  Procedures .Suture Removal Date/Time: 07/14/2017 6:50 PM Performed by: Alveria Apley, PA-C Authorized by: Alveria Apley, PA-C   Consent:    Consent obtained:  Verbal   Consent given by:  Patient   Risks discussed:  Wound separation, pain and bleeding Location:    Location:  Head/neck   Head/neck location:  Scalp Procedure details:    Wound appearance:  No signs of infection, nontender and good wound healing   Number of staples removed:  4 Post-procedure details:    Post-removal:  No dressing applied   Patient tolerance of procedure:  Tolerated well, no immediate complications   (including critical care time)  Medications Ordered in ED Medications - No data to display   Initial Impression / Assessment and Plan / ED Course  I have reviewed the triage  vital signs and the nursing notes.  Pertinent labs & imaging results that were available during my care of the patient were reviewed by me and considered in my medical decision making (see chart for details).     Pt presenting for table removal.  Evaluation shows well-healing incision without signs of infection or bleeding.  Staples removed without incident.  Aftercare instructions given.  At this time, patient appears safe for discharge.  Return precautions given.  Patient states he understands and agrees to plan.   Final Clinical Impressions(s) / ED Diagnoses   Final diagnoses:  Encounter for staple removal    ED  Discharge Orders    None       Alveria Apley, PA-C 07/14/17 1851    Abelino Derrick, MD 07/14/17 [phone]

## 2017-07-14 NOTE — ED Triage Notes (Signed)
PResents requesting staple removal from last Thursday to the top of his head. Denies pain.

## 2020-02-04 ENCOUNTER — Other Ambulatory Visit: Payer: Self-pay

## 2020-02-04 ENCOUNTER — Emergency Department (INDEPENDENT_AMBULATORY_CARE_PROVIDER_SITE_OTHER)
Admission: EM | Admit: 2020-02-04 | Discharge: 2020-02-04 | Disposition: A | Discharge status: Home / Self Care | Payer: BC Managed Care – PPO | Source: Home / Self Care

## 2020-02-04 DIAGNOSIS — H6122 Impacted cerumen, left ear: Secondary | ICD-10-CM

## 2020-02-04 DIAGNOSIS — H9202 Otalgia, left ear: Secondary | ICD-10-CM | POA: Diagnosis not present

## 2020-02-04 MED ORDER — CARBAMIDE PEROXIDE 6.5 % OT SOLN: 5.0000 [drp] | Freq: Two times a day (BID) | OTIC | 0 refills | Status: AC

## 2020-02-04 NOTE — ED Triage Notes (Signed)
Patient presents to Urgent Care with complaints of feeling like there is fluid built up behind the ear drum on his left ear. Patient reports it hurts to the touch and the pain extends down into his left jawline.Marland Kitchen

## 2020-02-04 NOTE — ED Provider Notes (Signed)
[facility]    CSN: [id number] Arrival date & time: 02/04/20  0954      History   Chief Complaint Chief Complaint  Patient presents with  . Otalgia    Left    HPI Donald Mercado is a 54 y.o. male.   HPI  Donald Mercado is a 54 y.o. male presenting to UC with c/o gradually worsening Left ear fullness and pain.  States it feels like fluid is built up behind his eardrum.  Ear is tender to touch, move, radiates into jaw. Denies recent URI symptoms of cough, congestion, sore throat or headache.  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   History reviewed. No pertinent past medical history.  There are no problems to display for this patient.   Past Surgical History:  Procedure Laterality Date  . KNEE SURGERY         Home Medications    Prior to Admission medications   Medication Sig Start Date End Date Taking? Authorizing Provider  buPROPion (WELLBUTRIN XL) 150 MG 24 hr tablet Take 150 mg by mouth daily.   Yes [provider]  furosemide (LASIX) 20 MG tablet Take 20 mg by mouth.   Yes [provider]  omeprazole (PRILOSEC) 20 MG capsule Take 20 mg by mouth daily.   Yes [provider]  carbamide peroxide (DEBROX) 6.5 % OTIC solution Place 5 drops into the left ear 2 (two) times daily. 02/04/20   Lurene Shadow, PA-C  meloxicam (MOBIC) 15 MG tablet Take 1 tablet (15 mg total) by mouth daily. Take with food each morning 03/04/13   Lattie Haw, MD    Family History Family History  Problem Relation Age of Onset  . Cancer Mother     Social History Social History   Tobacco Use  . Smoking status: Never Smoker  . Smokeless tobacco: Never Used  Substance Use Topics  . Alcohol use: Yes    Comment: weekly  . Drug use: No     Allergies   Niacin and related and Bromelains   Review of Systems Review of Systems  Constitutional: Negative for chills  and fever.  HENT: Positive for ear pain and hearing loss. Negative for congestion, rhinorrhea, sinus pressure and sinus pain.   Neurological: Negative for dizziness and headaches.     Physical Exam Triage Vital Signs ED Triage Vitals  Enc Vitals Group     BP 02/04/20 1011 123/88     Pulse Rate 02/04/20 1011 80     Resp 02/04/20 1011 16     Temp 02/04/20 1011 98.5 F (36.9 C)     Temp Source 02/04/20 1011 Oral     SpO2 02/04/20 1011 97 %     Weight --      Height --      Head Circumference --      Peak Flow --      Pain Score 02/04/20 1006 2     Pain Loc --      Pain Edu? --      Excl. in GC? --    No data found.  Updated Vital Signs BP 123/88 (BP Location: Right Arm)   Pulse 80   Temp 98.5 F (36.9 C) (Oral)   Resp 16   SpO2 97%   Visual Acuity Right Eye Distance:   Left Eye Distance:   Bilateral Distance:  Right Eye Near:   Left Eye Near:    Bilateral Near:     Physical Exam Vitals and nursing note reviewed.  Constitutional:      Appearance: Normal appearance. He is well-developed.  HENT:     Head: Normocephalic and atraumatic.     Right Ear: There is impacted cerumen (partial). Tympanic membrane is not erythematous or bulging.     Left Ear: There is impacted cerumen ( complete). No mastoid tenderness.     Nose: Nose normal.     Right Sinus: No maxillary sinus tenderness or frontal sinus tenderness.     Left Sinus: No maxillary sinus tenderness or frontal sinus tenderness.     Mouth/Throat:     Lips: Pink.     Mouth: Mucous membranes are moist.     Pharynx: Oropharynx is clear. Uvula midline.  Cardiovascular:     Rate and Rhythm: Normal rate and regular rhythm.  Pulmonary:     Effort: Pulmonary effort is normal. No respiratory distress.     Breath sounds: Normal breath sounds.  Musculoskeletal:        General: Normal range of motion.     Cervical back: Normal range of motion.  Skin:    General: Skin is warm and dry.  Neurological:     Mental  Status: He is alert and oriented to person, place, and time.  Psychiatric:        Behavior: Behavior normal.      UC Treatments / Results  Labs (all labs ordered are listed, but only abnormal results are displayed) Labs Reviewed - No data to display  EKG   Radiology No results found.  Procedures Procedures (including critical care time)  Medications Ordered in UC Medications - No data to display  Initial Impression / Assessment and Plan / UC Course  I have reviewed the triage vital signs and the nursing notes.  Pertinent labs & imaging results that were available during my care of the patient were reviewed by me and considered in my medical decision making (see chart for details).    Cerumen impaction in Left ear contributing to Left ear pain Unsuccessful removal of impacted cerumen in UC today Rx: debrox F/u with ENT later this week or next if not improving.  Final Clinical Impressions(s) / UC Diagnoses   Final diagnoses:  Left ear pain  Impacted cerumen of left ear   Discharge Instructions   None    ED Prescriptions    Medication Sig Dispense Auth. Provider   carbamide peroxide (DEBROX) 6.5 % OTIC solution Place 5 drops into the left ear 2 (two) times daily. 15 mL Lurene Shadow, PA-C     PDMP not reviewed this encounter.   Lurene Shadow, PA-C 02/04/20 1146
